# Patient Record
Sex: Female | Born: 2000 | Race: Black or African American | Hispanic: No | Marital: Single | Smoking: Never smoker
Health system: Southern US, Community
[De-identification: ages and names within clinical notes are randomized; demographics above are authoritative.]

## PROBLEM LIST (undated history)

## (undated) DIAGNOSIS — Z789 Other specified health status: Secondary | ICD-10-CM

## (undated) HISTORY — PX: NO PAST SURGERIES: SHX2092

---
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
## 2019-12-07 ENCOUNTER — Ambulatory Visit: Payer: Federal, State, Local not specified - PPO | Attending: Internal Medicine

## 2019-12-07 DIAGNOSIS — Z23 Encounter for immunization: Secondary | ICD-10-CM

## 2019-12-07 NOTE — Progress Notes (Signed)
   Covid-19 Vaccination Clinic  Name:  RAYANNA MATUSIK    MRN: [id number] DOB: 2001/08/07  12/07/2019  Ms. Sabatino was observed post Covid-19 immunization for 15 minutes without incident. She was provided with Vaccine Information Sheet and instruction to access the V-Safe system.   Ms. Sampedro was instructed to call 911 with any severe reactions post vaccine: Marland Kitchen Difficulty breathing  . Swelling of face and throat  . A fast heartbeat  . A bad rash all over body  . Dizziness and weakness   Immunizations Administered    Name Date Dose VIS Date Route   Pfizer COVID-19 Vaccine 12/07/2019 12:31 PM 0.3 mL 08/02/2019 Intramuscular   Manufacturer: [employer], [employer]   Lot: [id number]   NDC: [address]-4

## 2020-01-01 ENCOUNTER — Ambulatory Visit: Payer: Federal, State, Local not specified - PPO

## 2020-01-30 ENCOUNTER — Ambulatory Visit: Payer: Federal, State, Local not specified - PPO | Attending: Internal Medicine

## 2020-01-30 DIAGNOSIS — Z23 Encounter for immunization: Secondary | ICD-10-CM

## 2020-01-30 NOTE — Progress Notes (Signed)
   Covid-19 Vaccination Clinic  Name:  LISA MILIAN    MRN: [id number] DOB: 2001-08-20  01/30/2020  Ms. Roszak was observed post Covid-19 immunization for 15 minutes without incident. She was provided with Vaccine Information Sheet and instruction to access the V-Safe system.   Ms. Riga was instructed to call 911 with any severe reactions post vaccine: Marland Kitchen Difficulty breathing  . Swelling of face and throat  . A fast heartbeat  . A bad rash all over body  . Dizziness and weakness   Immunizations Administered    Name Date Dose VIS Date Route   Pfizer COVID-19 Vaccine 01/30/2020  1:32 PM 0.3 mL 10/16/2018 Intramuscular   Manufacturer: [employer], [employer]   Lot: [id number]   NDC: [address]-0

## 2023-08-23 NOTE — L&D Delivery Note (Signed)
 Operative Delivery Note At 2:12 PM a viable and healthy female was delivered via Vaginal, Vacuum Investment banker, operational).  Presentation: vertex; Position: Left,, Occiput,, Anterior; Station: +2.  Verbal consent: obtained from patient.  Risks and benefits discussed in detail.  Risks include, but are not limited to the risks of anesthesia, bleeding, infection, damage to maternal tissues, fetal cephalhematoma.  There is also the risk of inability to effect vaginal delivery of the head, or shoulder dystocia that cannot be resolved by established maneuvers, leading to the need for emergency cesarean section. Indication: terminal bradycardia APGAR: 8, 9; weight 7 lb 7.2 oz (3380 g).   Placenta status:  spontaneous intact not sent, .   Cord:  with the following complications:  tight nuchal cord x 1 clamped and cut.  Cord pH: 7.16 Indication: terminal bradycardia Anesthesia:  eipidural Instruments: mushroom vacuum Episiotomy: Median Lacerations: 4th degree Suture Repair:  the upper limit of the rectal mucosa was identified. Length of the rectal laceration  3.5 cm. The rectal mucosa was approximated with 4-0vicryl  suture, the endovaginal fascia was approximated with 2-0 Vicryl   suture, the sphincter were isolated and O vicryl figure of eight sutures x 4 was placed. The remaining repair was then closed with 3-0 chromic suture in layers and the skin approximated with 3-0 chromic sutures. Rectal exam, revealed good approximation Est. Blood Loss (mL): 570  Mom to postpartum.  Baby to Couplet care / Skin to Skin.  Anna Bentley A Anna Bentley 04/25/2024, 4:40 PM

## 2023-10-11 LAB — OB RESULTS CONSOLE VARICELLA ZOSTER ANTIBODY, IGG: Varicella: IMMUNE

## 2023-10-11 LAB — OB RESULTS CONSOLE HEPATITIS B SURFACE ANTIGEN: Hepatitis B Surface Ag: NEGATIVE

## 2023-10-11 LAB — OB RESULTS CONSOLE HIV ANTIBODY (ROUTINE TESTING): HIV: NONREACTIVE

## 2023-10-11 LAB — HEPATITIS C ANTIBODY: HCV Ab: NEGATIVE

## 2023-10-11 LAB — OB RESULTS CONSOLE RUBELLA ANTIBODY, IGM: Rubella: IMMUNE

## 2023-10-11 LAB — OB RESULTS CONSOLE RPR: RPR: NONREACTIVE

## 2024-01-31 LAB — OB RESULTS CONSOLE RPR: RPR: NONREACTIVE

## 2024-03-27 LAB — OB RESULTS CONSOLE GBS: GBS: POSITIVE

## 2024-04-21 ENCOUNTER — Inpatient Hospital Stay (HOSPITAL_COMMUNITY): Admission: RE | Admit: 2024-04-21 | Source: Home / Self Care | Admitting: Obstetrics and Gynecology

## 2024-04-24 ENCOUNTER — Encounter (HOSPITAL_COMMUNITY): Payer: Self-pay | Admitting: *Deleted

## 2024-04-24 ENCOUNTER — Other Ambulatory Visit: Payer: Self-pay

## 2024-04-24 ENCOUNTER — Inpatient Hospital Stay (HOSPITAL_COMMUNITY)
Admission: AD | Admit: 2024-04-24 | Discharge: 2024-04-27 | DRG: 768 | Disposition: A | Discharge status: Home / Self Care | Attending: Obstetrics and Gynecology | Admitting: Obstetrics and Gynecology

## 2024-04-24 DIAGNOSIS — Z833 Family history of diabetes mellitus: Secondary | ICD-10-CM

## 2024-04-24 DIAGNOSIS — O9902 Anemia complicating childbirth: Secondary | ICD-10-CM | POA: Diagnosis present

## 2024-04-24 DIAGNOSIS — O99824 Streptococcus B carrier state complicating childbirth: Secondary | ICD-10-CM | POA: Diagnosis present

## 2024-04-24 DIAGNOSIS — Z3A40 40 weeks gestation of pregnancy: Secondary | ICD-10-CM

## 2024-04-24 HISTORY — DX: Other specified health status: Z78.9

## 2024-04-24 LAB — POCT FERN TEST: POCT Fern Test: NEGATIVE

## 2024-04-24 MED ORDER — LACTATED RINGERS IV SOLN: INTRAVENOUS | Status: DC

## 2024-04-24 NOTE — MAU Note (Incomplete)
 Anna Bentley is a 23 y.o. at [redacted]w[redacted]d here in MAU reporting: Ctx since 6am. Now apart Reports some wetness but no big gushes of fluid and denise bleeding. Good fetal movement reported.   LMP:  Onset of complaint:6am Pain score: 6 Vitals:   04/24/24 2255  BP: (!) 133/90  Pulse: (!) 107  Resp: 18  Temp: 98 F (36.7 C)     FHT: 145  Lab orders placed from triage: labor  Ctx since

## 2024-04-25 ENCOUNTER — Inpatient Hospital Stay (HOSPITAL_COMMUNITY): Admitting: Anesthesiology

## 2024-04-25 ENCOUNTER — Encounter (HOSPITAL_COMMUNITY): Payer: Self-pay | Admitting: Obstetrics and Gynecology

## 2024-04-25 DIAGNOSIS — O99824 Streptococcus B carrier state complicating childbirth: Secondary | ICD-10-CM | POA: Diagnosis present

## 2024-04-25 DIAGNOSIS — O26893 Other specified pregnancy related conditions, third trimester: Secondary | ICD-10-CM | POA: Diagnosis present

## 2024-04-25 DIAGNOSIS — O9902 Anemia complicating childbirth: Secondary | ICD-10-CM | POA: Diagnosis present

## 2024-04-25 DIAGNOSIS — Z3A40 40 weeks gestation of pregnancy: Secondary | ICD-10-CM | POA: Diagnosis not present

## 2024-04-25 DIAGNOSIS — Z833 Family history of diabetes mellitus: Secondary | ICD-10-CM | POA: Diagnosis not present

## 2024-04-25 LAB — CBC
HCT: 36.8 % (ref 36.0–46.0)
HCT: 36.9 % (ref 36.0–46.0)
Hemoglobin: 11.7 g/dL — ABNORMAL LOW (ref 12.0–15.0)
Hemoglobin: 11.7 g/dL — ABNORMAL LOW (ref 12.0–15.0)
MCH: 28.1 pg (ref 26.0–34.0)
MCH: 28.5 pg (ref 26.0–34.0)
MCHC: 31.7 g/dL (ref 30.0–36.0)
MCHC: 31.8 g/dL (ref 30.0–36.0)
MCV: 88.5 fL (ref 80.0–100.0)
MCV: 89.5 fL (ref 80.0–100.0)
Platelets: 265 K/uL (ref 150–400)
Platelets: 275 K/uL (ref 150–400)
RBC: 4.11 MIL/uL (ref 3.87–5.11)
RBC: 4.17 MIL/uL (ref 3.87–5.11)
RDW: 15.2 % (ref 11.5–15.5)
RDW: 15.2 % (ref 11.5–15.5)
WBC: 10.2 K/uL (ref 4.0–10.5)
WBC: 8.8 K/uL (ref 4.0–10.5)
nRBC: 0 % (ref 0.0–0.2)
nRBC: 0 % (ref 0.0–0.2)

## 2024-04-25 LAB — TYPE AND SCREEN
ABO/RH(D): A POS
Antibody Screen: NEGATIVE

## 2024-04-25 LAB — RPR: RPR Ser Ql: NONREACTIVE

## 2024-04-25 MED ORDER — OXYTOCIN BOLUS FROM INFUSION
333.0000 mL | Freq: Once | INTRAVENOUS | Status: AC
  Administered 2024-04-25: 333 mL via INTRAVENOUS

## 2024-04-25 MED ORDER — ONDANSETRON HCL 4 MG PO TABS
4.0000 mg | ORAL_TABLET | ORAL | Status: DC | PRN
Start: 2024-04-25 — End: 2024-04-27

## 2024-04-25 MED ORDER — PRENATAL MULTIVITAMIN CH
1.0000 | ORAL_TABLET | Freq: Every day | ORAL | Status: DC
  Administered 2024-04-26 – 2024-04-27 (×2): 1 via ORAL
  Filled 2024-04-25 (×2): qty 1

## 2024-04-25 MED ORDER — SENNOSIDES-DOCUSATE SODIUM 8.6-50 MG PO TABS
2.0000 | ORAL_TABLET | Freq: Every day | ORAL | Status: DC
Start: 2024-04-26 — End: 2024-04-27
  Administered 2024-04-26 – 2024-04-27 (×2): 2 via ORAL
  Filled 2024-04-25 (×2): qty 2

## 2024-04-25 MED ORDER — LACTATED RINGERS IV SOLN
500.0000 mL | Freq: Once | INTRAVENOUS | Status: AC
  Administered 2024-04-25: 500 mL via INTRAVENOUS

## 2024-04-25 MED ORDER — PHENYLEPHRINE 80 MCG/ML (10ML) SYRINGE FOR IV PUSH (FOR BLOOD PRESSURE SUPPORT)
80.0000 ug | PREFILLED_SYRINGE | INTRAVENOUS | Status: DC | PRN
  Filled 2024-04-25: qty 10

## 2024-04-25 MED ORDER — EPHEDRINE 5 MG/ML INJ: 10.0000 mg | INTRAVENOUS | Status: DC | PRN

## 2024-04-25 MED ORDER — DIPHENHYDRAMINE HCL 25 MG PO CAPS: 25.0000 mg | ORAL_CAPSULE | Freq: Four times a day (QID) | ORAL | Status: DC | PRN

## 2024-04-25 MED ORDER — SOD CITRATE-CITRIC ACID 500-334 MG/5ML PO SOLN: 30.0000 mL | ORAL | Status: DC | PRN

## 2024-04-25 MED ORDER — ONDANSETRON HCL 4 MG/2ML IJ SOLN: 4.0000 mg | INTRAMUSCULAR | Status: DC | PRN

## 2024-04-25 MED ORDER — TERBUTALINE SULFATE 1 MG/ML IJ SOLN: 0.2500 mg | Freq: Once | INTRAMUSCULAR | Status: DC | PRN

## 2024-04-25 MED ORDER — BENZOCAINE-MENTHOL 20-0.5 % EX AERO
1.0000 | INHALATION_SPRAY | CUTANEOUS | Status: DC | PRN
  Administered 2024-04-26: 1 via TOPICAL
  Filled 2024-04-25 (×2): qty 56

## 2024-04-25 MED ORDER — OXYTOCIN 10 UNIT/ML IJ SOLN: 10.0000 [IU] | Freq: Once | INTRAMUSCULAR | Status: DC

## 2024-04-25 MED ORDER — DIPHENHYDRAMINE HCL 50 MG/ML IJ SOLN
25.0000 mg | Freq: Once | INTRAMUSCULAR | Status: AC
  Administered 2024-04-25: 25 mg via INTRAVENOUS
  Filled 2024-04-25: qty 1

## 2024-04-25 MED ORDER — FENTANYL CITRATE (PF) 100 MCG/2ML IJ SOLN: 100.0000 ug | INTRAMUSCULAR | Status: DC | PRN

## 2024-04-25 MED ORDER — ONDANSETRON HCL 4 MG/2ML IJ SOLN: 4.0000 mg | Freq: Four times a day (QID) | INTRAMUSCULAR | Status: DC | PRN

## 2024-04-25 MED ORDER — OXYCODONE HCL 5 MG PO TABS: 5.0000 mg | ORAL_TABLET | ORAL | Status: DC | PRN

## 2024-04-25 MED ORDER — OXYCODONE-ACETAMINOPHEN 5-325 MG PO TABS: 2.0000 | ORAL_TABLET | ORAL | Status: DC | PRN

## 2024-04-25 MED ORDER — LIDOCAINE HCL (PF) 1 % IJ SOLN
30.0000 mL | INTRAMUSCULAR | Status: AC | PRN
  Administered 2024-04-25: 30 mL via SUBCUTANEOUS
  Filled 2024-04-25: qty 30

## 2024-04-25 MED ORDER — ACETAMINOPHEN 325 MG PO TABS: 650.0000 mg | ORAL_TABLET | ORAL | Status: DC | PRN

## 2024-04-25 MED ORDER — DIPHENHYDRAMINE HCL 50 MG/ML IJ SOLN: 12.5000 mg | INTRAMUSCULAR | Status: DC | PRN

## 2024-04-25 MED ORDER — ZOLPIDEM TARTRATE 5 MG PO TABS: 5.0000 mg | ORAL_TABLET | Freq: Every evening | ORAL | Status: DC | PRN

## 2024-04-25 MED ORDER — OXYCODONE-ACETAMINOPHEN 5-325 MG PO TABS: 1.0000 | ORAL_TABLET | ORAL | Status: DC | PRN

## 2024-04-25 MED ORDER — DIBUCAINE (PERIANAL) 1 % EX OINT
1.0000 | TOPICAL_OINTMENT | CUTANEOUS | Status: DC | PRN
Start: 2024-04-25 — End: 2024-04-27

## 2024-04-25 MED ORDER — OXYCODONE HCL 5 MG PO TABS: 10.0000 mg | ORAL_TABLET | ORAL | Status: DC | PRN

## 2024-04-25 MED ORDER — OXYTOCIN-SODIUM CHLORIDE 30-0.9 UT/500ML-% IV SOLN
2.5000 [IU]/h | INTRAVENOUS | Status: DC
  Administered 2024-04-25: 2.5 [IU]/h via INTRAVENOUS

## 2024-04-25 MED ORDER — OXYTOCIN-SODIUM CHLORIDE 30-0.9 UT/500ML-% IV SOLN
1.0000 m[IU]/min | INTRAVENOUS | Status: DC
  Administered 2024-04-25: 2 m[IU]/min via INTRAVENOUS
  Filled 2024-04-25: qty 500

## 2024-04-25 MED ORDER — SIMETHICONE 80 MG PO CHEW: 80.0000 mg | CHEWABLE_TABLET | ORAL | Status: DC | PRN

## 2024-04-25 MED ORDER — FERROUS SULFATE 325 (65 FE) MG PO TABS
325.0000 mg | ORAL_TABLET | Freq: Two times a day (BID) | ORAL | Status: DC
  Administered 2024-04-26 – 2024-04-27 (×3): 325 mg via ORAL
  Filled 2024-04-25 (×3): qty 1

## 2024-04-25 MED ORDER — IBUPROFEN 600 MG PO TABS
600.0000 mg | ORAL_TABLET | Freq: Four times a day (QID) | ORAL | Status: DC
  Administered 2024-04-25 – 2024-04-27 (×7): 600 mg via ORAL
  Filled 2024-04-25 (×8): qty 1

## 2024-04-25 MED ORDER — LIDOCAINE HCL (PF) 1 % IJ SOLN
INTRAMUSCULAR | Status: DC | PRN
  Administered 2024-04-25: 8 mL via EPIDURAL

## 2024-04-25 MED ORDER — MINERAL OIL PO OIL
TOPICAL_OIL | Freq: Every day | ORAL | Status: DC
  Administered 2024-04-26 – 2024-04-27 (×2): 30 mL via ORAL
  Filled 2024-04-25 (×3): qty 30

## 2024-04-25 MED ORDER — PHENYLEPHRINE 80 MCG/ML (10ML) SYRINGE FOR IV PUSH (FOR BLOOD PRESSURE SUPPORT): 80.0000 ug | PREFILLED_SYRINGE | INTRAVENOUS | Status: DC | PRN

## 2024-04-25 MED ORDER — PENICILLIN G POT IN DEXTROSE 60000 UNIT/ML IV SOLN
3.0000 10*6.[IU] | INTRAVENOUS | Status: DC
  Administered 2024-04-25 (×2): 3 10*6.[IU] via INTRAVENOUS
  Filled 2024-04-25 (×2): qty 50

## 2024-04-25 MED ORDER — FENTANYL-BUPIVACAINE-NACL 0.5-0.125-0.9 MG/250ML-% EP SOLN
12.0000 mL/h | EPIDURAL | Status: DC | PRN
  Administered 2024-04-25: 12 mL/h via EPIDURAL
  Filled 2024-04-25: qty 250

## 2024-04-25 MED ORDER — WITCH HAZEL-GLYCERIN EX PADS: 1.0000 | MEDICATED_PAD | CUTANEOUS | Status: DC | PRN

## 2024-04-25 MED ORDER — COCONUT OIL OIL: 1.0000 | TOPICAL_OIL | Status: DC | PRN

## 2024-04-25 MED ORDER — LACTATED RINGERS IV SOLN: 500.0000 mL | INTRAVENOUS | Status: DC | PRN

## 2024-04-25 MED ORDER — PENICILLIN G POTASSIUM 5000000 UNITS IJ SOLR
5.0000 10*6.[IU] | Freq: Once | INTRAMUSCULAR | Status: AC
  Administered 2024-04-25: 5 10*6.[IU] via INTRAVENOUS
  Filled 2024-04-25: qty 5

## 2024-04-25 MED ORDER — CALCIUM CARBONATE ANTACID 500 MG PO CHEW
400.0000 mg | CHEWABLE_TABLET | Freq: Once | ORAL | Status: AC
  Administered 2024-04-25: 400 mg via ORAL
  Filled 2024-04-25: qty 2

## 2024-04-25 NOTE — H&P (Signed)
 Anna Bentley is a 23 y.o. female presenting @ 40 3/[redacted] wk gestation in early labor. GBS cx positive. OB History     Gravida  1   Para      Term      Preterm      AB      Living         SAB      IAB      Ectopic      Multiple      Live Births             Past Medical History:  Diagnosis Date   Medical history non-contributory    Past Surgical History:  Procedure Laterality Date   NO PAST SURGERIES     Family History: family history includes Cancer in her maternal grandmother; Diabetes in her mother. Social History:  reports that she has never smoked. She has never used smokeless tobacco. She reports that she does not drink alcohol and does not use drugs.     Maternal Diabetes: No Genetic Screening: Normal Maternal Ultrasounds/Referrals: Normal Fetal Ultrasounds or other Referrals:  None Maternal Substance Abuse:  No Significant Maternal Medications:  None Significant Maternal Lab Results:  Group B Strep positive Number of Prenatal Visits:greater than 3 verified prenatal visits Maternal Vaccinations:TDap Other Comments:  None  Review of Systems  All other systems reviewed and are negative.  History Dilation: 4 Effacement (%): 80 Station: -2 Exam by:: Anna Foods, RN Blood pressure (!) 138/90, pulse 91, temperature 98 F (36.7 C), resp. rate 18, height 5' 4 (1.626 m), weight 84.4 kg, SpO2 99%. Exam Physical Exam Constitutional:      Appearance: Normal appearance.  HENT:     Head: Atraumatic.  Eyes:     Extraocular Movements: Extraocular movements intact.  Cardiovascular:     Rate and Rhythm: Regular rhythm.     Heart sounds: Normal heart sounds.  Pulmonary:     Breath sounds: Normal breath sounds.  Abdominal:     Comments: gravid  Musculoskeletal:     Cervical back: Neck supple.  Skin:    General: Skin is warm and dry.  Neurological:     General: No focal deficit present.     Mental Status: She is alert and oriented to person,  place, and time.  Psychiatric:        Mood and Affect: Mood normal.        Behavior: Behavior normal.     Prenatal labs: ABO, Rh: --/--/PENDING (09/04 0017) Antibody: PENDING (09/04 0017) Rubella:  Immune RPR:   NR HBsAg:   neg HIV:   neg GBS:   positive  Assessment/Plan: Early labor GBS cx positive Postterm P) admit routine labs. IV PCN. Analgesic prn. Pitocin  prn   Anna Bentley 04/25/2024, 1:14 AM

## 2024-04-25 NOTE — Anesthesia Procedure Notes (Addendum)
 Epidural Patient location during procedure: OB Start time: 04/25/2024 5:48 AM End time: 04/25/2024 6:04 AM  Staffing Anesthesiologist: Merla Almarie HERO, DO Performed: anesthesiologist   Preanesthetic Checklist Completed: patient identified, IV checked, risks and benefits discussed, monitors and equipment checked, pre-op evaluation and timeout performed  Epidural Patient position: sitting Prep: DuraPrep and site prepped and draped Patient monitoring: continuous pulse ox, blood pressure, heart rate and cardiac monitor Approach: midline Location: L3-L4 Injection technique: LOR air  Needle:  Needle type: Tuohy  Needle gauge: 17 G Needle length: 9 cm Needle insertion depth: 6 cm Catheter type: closed end flexible Catheter size: 19 Gauge Catheter at skin depth: 11 cm Test dose: negative  Assessment Sensory level: T8 Events: blood not aspirated, cerebrospinal fluid, injection not painful, no injection resistance, no paresthesia and negative IV test  Additional Notes Patient identified. Risks/Benefits/Options discussed with patient including but not limited to bleeding, infection, nerve damage, paralysis, failed block, incomplete pain control, headache, blood pressure changes, nausea, vomiting, reactions to medication both or allergic, itching and postpartum back pain. Confirmed with bedside nurse the patient's most recent platelet count. Confirmed with patient that they are not currently taking any anticoagulation, have any bleeding history or any family history of bleeding disorders. Patient expressed understanding and wished to proceed. All questions were answered. Sterile technique was used throughout the entire procedure. Please see nursing notes for vital signs. Test dose was given through epidural catheter and negative prior to continuing to dose epidural or start infusion. Warning signs of high block given to the patient including shortness of breath, tingling/numbness in hands,  complete motor block, or any concerning symptoms with instructions to call for help. Patient was given instructions on fall risk and not to get out of bed. All questions and concerns addressed with instructions to call with any issues or inadequate analgesia.    Pt very jumpy, crying during procedure. + wet tap at L2-3, normal placement at L4-5.Reason for block:procedure for pain

## 2024-04-25 NOTE — Anesthesia Preprocedure Evaluation (Signed)
 Anesthesia Evaluation  Patient identified by MRN, date of birth, ID band Patient awake    Reviewed: Allergy & Precautions, Patient's Chart, lab work & pertinent test results  Airway Mallampati: II  TM Distance: >3 FB Neck ROM: Full    Dental no notable dental hx.    Pulmonary neg pulmonary ROS   Pulmonary exam normal breath sounds clear to auscultation       Cardiovascular negative cardio ROS Normal cardiovascular exam Rhythm:Regular Rate:Normal     Neuro/Psych negative neurological ROS  negative psych ROS   GI/Hepatic negative GI ROS, Neg liver ROS,,,  Endo/Other  negative endocrine ROS    Renal/GU negative Renal ROS  negative genitourinary   Musculoskeletal negative musculoskeletal ROS (+)    Abdominal   Peds negative pediatric ROS (+)  Hematology  (+) Blood dyscrasia, anemia Hb 11.7, plt 275   Anesthesia Other Findings   Reproductive/Obstetrics (+) Pregnancy                              Anesthesia Physical Anesthesia Plan  ASA: 2  Anesthesia Plan: Epidural   Post-op Pain Management:    Induction:   PONV Risk Score and Plan: 2  Airway Management Planned: Natural Airway  Additional Equipment: None  Intra-op Plan:   Post-operative Plan:   Informed Consent: I have reviewed the patients History and Physical, chart, labs and discussed the procedure including the risks, benefits and alternatives for the proposed anesthesia with the patient or authorized representative who has indicated his/her understanding and acceptance.       Plan Discussed with:   Anesthesia Plan Comments:         Anesthesia Quick Evaluation

## 2024-04-25 NOTE — Progress Notes (Signed)
 Anna Bentley is a 23 y.o. G1P0 at [redacted]w[redacted]d by LMP admitted for active labor  Subjective: Chief Complaint  Patient presents with   Labor Eval  Epidural  Objective: BP (!) 100/52   Pulse 91   Temp 97.9 F (36.6 C) (Oral)   Resp 16   Ht 5' 4 (1.626 m)   Wt 84.4 kg   SpO2 99%   BMI 31.93 kg/m  No intake/output data recorded. No intake/output data recorded.  FHT:  FHR: 120 bpm, variability: moderate,  accelerations:  Present,  decelerations:  Absent UC:   irregular, every 2-5 minutes SVE:   5 cm dilated,  sl edematous effaced, -2 station ROP Tracing: cat 1  Labs: Lab Results  Component Value Date   WBC 8.8 04/25/2024   HGB 11.7 (L) 04/25/2024   HCT 36.9 04/25/2024   MCV 88.5 04/25/2024   PLT 275 04/25/2024    Assessment / Plan: Arrest of dilation Postterm P) start pitocin .  Start circuit   Anticipated MOD:  NSVD  Keiyana Stehr A Varnell Orvis 04/25/2024, 8:14 AM

## 2024-04-26 LAB — CBC
HCT: 31.3 % — ABNORMAL LOW (ref 36.0–46.0)
Hemoglobin: 10.3 g/dL — ABNORMAL LOW (ref 12.0–15.0)
MCH: 28.5 pg (ref 26.0–34.0)
MCHC: 32.9 g/dL (ref 30.0–36.0)
MCV: 86.5 fL (ref 80.0–100.0)
Platelets: 242 K/uL (ref 150–400)
RBC: 3.62 MIL/uL — ABNORMAL LOW (ref 3.87–5.11)
RDW: 15.2 % (ref 11.5–15.5)
WBC: 15 K/uL — ABNORMAL HIGH (ref 4.0–10.5)
nRBC: 0 % (ref 0.0–0.2)

## 2024-04-26 NOTE — Lactation Note (Signed)
 This note was copied from a baby's chart. Lactation Consultation Note  Patient Name: Anna Bentley Date: 04/26/2024 Age:24 hours Reason for consult: Initial assessment;1st time breastfeeding;Term  P1, Mother states she would like to breastfeed and formula feed. Baby recently consumed 4 ml of formula. Reviewed hand expression bilaterally. Attempted latching in both cross cradle and football holds placing baby skin to skin on mother. Baby opened wide and latched but did not suck after a few attempts, feeding attempt stopped. Suggest calling for [facility] or RN to assist with next feeding.  Feed on demand with cues.  Goal 8-12+ times per day after first 24 hrs.  Place baby STS if not cueing.  Encouraged mother to offer the breast before formula to help establish her milk supply.   Maternal Data Has patient been taught Hand Expression?: Yes Does the patient have breastfeeding experience prior to this delivery?: No  Feeding Mother's Current Feeding Choice: Breast Milk and Formula  LATCH Score Latch: Repeated attempts needed to sustain latch, nipple held in mouth throughout feeding, stimulation needed to elicit sucking reflex.  Audible Swallowing: None  Type of Nipple: Everted at rest and after stimulation  Comfort (Breast/Nipple): Soft / non-tender  Hold (Positioning): Assistance needed to correctly position infant at breast and maintain latch.  LATCH Score: 6 Interventions Interventions: Breast feeding basics reviewed;Assisted with latch;Skin to skin;Hand express;Position options;Support pillows;Education;CDC milk storage guidelines;LC Services brochure  Discharge Pump: Personal;Hands Free  Consult Status Consult Status: Follow-up Date: 04/27/24 Follow-up type: In-patient   Shannon Dines Boschen  RN, IBCLC 04/26/2024, 8:12 AM

## 2024-04-26 NOTE — Anesthesia Postprocedure Evaluation (Signed)
 Anesthesia Post Note  Patient: RAYNELL UPTON  Procedure(s) Performed: AN AD HOC LABOR EPIDURAL     Patient location during evaluation: Mother Baby Anesthesia Type: Epidural Level of consciousness: awake Pain management: pain level controlled Vital Signs Assessment: post-procedure vital signs reviewed and stable Respiratory status: spontaneous breathing, nonlabored ventilation and respiratory function stable Cardiovascular status: stable Postop Assessment: no headache, no backache and epidural receding Comments: Patient had accidental dural puncture during epidural placement. She is now >30 hours out from placement. She denies headache. She was sitting up on the side of the bed holding her baby upon my entering the room. She has been up walking around and going to the bathroom without issue. She requested that her epidural be removed and she would like to shower. Discussed symptoms of PDPH with patient and instructed her to have the nurse call us  if she develops those symptoms. She expressed understanding.   No notable events documented.  Last Vitals:  Vitals:   04/26/24 0832 04/26/24 0848  BP: 118/85 120/79  Pulse: 89 76  Resp: 17   Temp: 36.6 C   SpO2: 99%     Last Pain:  Vitals:   04/26/24 1110  TempSrc:   PainSc: 4                  Delon Aisha Arch

## 2024-04-26 NOTE — Progress Notes (Signed)
 PPD1 SVD:   S:  Pt reports feeling well/ Tolerating po/ Voiding without problems/ No n/v/ Bleeding is moderate/ Pain controlled withprescription NSAID's including ibuprofen  (Motrin )  Newborn info live female BRF/BF   O:  A & O x 3 / VS: Blood pressure 117/75, pulse 95, temperature 98.2 F (36.8 C), resp. rate 16, height 5' 4 (1.626 m), weight 84.4 kg, SpO2 98%, unknown if currently breastfeeding.  LABS:  Results for orders placed or performed during the hospital encounter of 04/24/24 (from the past 24 hours)  CBC     Status: Abnormal   Collection Time: 04/25/24  4:02 PM  Result Value Ref Range   WBC 10.2 4.0 - 10.5 K/uL   RBC 4.11 3.87 - 5.11 MIL/uL   Hemoglobin 11.7 (L) 12.0 - 15.0 g/dL   HCT 63.1 63.9 - 53.9 %   MCV 89.5 80.0 - 100.0 fL   MCH 28.5 26.0 - 34.0 pg   MCHC 31.8 30.0 - 36.0 g/dL   RDW 84.7 88.4 - 84.4 %   Platelets 265 150 - 400 K/uL   nRBC 0.0 0.0 - 0.2 %    I&O: I/O last 3 completed shifts: In: -  Out: 2897 [Urine:2175; Blood:722]   No intake/output data recorded.  Lungs: chest clear, no wheezing, rales, normal symmetric air entry  Heart: regular rate and rhythm, S1, S2 normal, no murmur, click, rub or gallop  Abdomen: uterus firm at umb nontender  Perineum: healing with good reapproximation  Lochia: moderate  Extremities:no redness or tenderness in the calves or thighs, no edema    A/P: PPD # 1/ G1P1001 S/p VAVD with 4th degree extension  Doing well  Continue routine post partum orders  Anticipate d/c in am

## 2024-04-26 NOTE — Lactation Note (Signed)
 This note was copied from a baby's chart. Lactation Consultation Note  Patient Name: Anna Bentley Unijb'd Date: 04/26/2024 Age:24 hours  2nd time attempted to see mom. Dad was changing baby's diaper. Mom resting. Mom stated she hasn't attempted to BF yet she has felt overwhelmed. She will try today. Encouraged mom to call for Lactation today to see latch. Mom shook her head yes.   Maternal Data    Feeding    LATCH Score                    Lactation Tools Discussed/Used    Interventions    Discharge    Consult Status      Telesforo Brosnahan G 04/26/2024, 2:48 AM

## 2024-04-26 NOTE — Lactation Note (Signed)
 This note was copied from a baby's chart. Lactation Consultation Note  Patient Name: Anna Bentley Unijb'd Date: 04/26/2024 Age:24 hours Reason for consult: Follow-up assessment;1st time breastfeeding;Term  P1, Returned to room to assist with feeding. Baby demonstrating feeding cues. Reviewed basics with mother including suck training.  Assisted with latching and baby latched briefly, a few sucking bursts.   Baby still continues to have difficulty grabbing and pulling nipple at this time. Baby was supplement with slow flow nipple and formula. A few attempts were made also with the botte to elicit sucking. Call for help with latching as needed.    Maternal Data Has patient been taught Hand Expression?: Yes  Feeding Mother's Current Feeding Choice: Breast Milk and Formula  LATCH Score Latch: Repeated attempts needed to sustain latch, nipple held in mouth throughout feeding, stimulation needed to elicit sucking reflex.  Audible Swallowing: None  Type of Nipple: Everted at rest and after stimulation  Comfort (Breast/Nipple): Soft / non-tender  Hold (Positioning): Assistance needed to correctly position infant at breast and maintain latch.  LATCH Score: 6   Lactation Tools Discussed/Used  Manual pump  Interventions Interventions: Assisted with latch;Skin to skin;Hand express;Hand pump;Education  Discharge Pump: Personal;Hands Free;Manual  Consult Status Consult Status: Follow-up Date: 04/27/24 Follow-up type: In-patient   Shannon Levorn Lemme  RN, IBCLC 04/26/2024, 12:22 PM

## 2024-04-27 MED ORDER — IBUPROFEN 600 MG PO TABS: 600.0000 mg | ORAL_TABLET | Freq: Four times a day (QID) | ORAL | 11 refills | Status: AC | PRN

## 2024-04-27 MED ORDER — MINERAL OIL PO OIL: TOPICAL_OIL | ORAL | 2 refills | Status: AC

## 2024-04-27 MED ORDER — SENNOSIDES-DOCUSATE SODIUM 8.6-50 MG PO TABS: 2.0000 | ORAL_TABLET | Freq: Every day | ORAL | 11 refills | Status: AC

## 2024-04-27 NOTE — Progress Notes (Signed)
 CSW received and acknowledges consult for EDPS of 9. Consult screened out due to 9 on EDPS does not warrant a CSW consult. MOB whom scores are greater than 9/yes to question 10 on [place] Postpartum Depression Screen warrants a CSW consult.   Signed,  Sharyne LOIS Roulette, MSW, LCSWA, LCASA 04/27/2024 8:33 AM

## 2024-04-27 NOTE — Progress Notes (Signed)
 PPD{NUMBERS 1-5:20334} SVD:   S:  Pt reports feeling ***/ Tolerating po/ Voiding without problems/ No n/v/ Bleeding is {Description; bleeding vaginal:11356}/ Pain controlled with{treatments; pain control med:13496}  Newborn info ***   O:  A & O x 3 ***/ VS: Blood pressure 121/76, pulse 76, temperature 97.9 F (36.6 C), temperature source Oral, resp. rate 18, height 5' 4 (1.626 m), weight 84.4 kg, SpO2 99%, unknown if currently breastfeeding.  LABS: No results found for this or any previous visit (from the past 24 hours).  I&O: No intake/output data recorded.   No intake/output data recorded.  Lungs: {Exam; lungs:5033}  Heart: {Exam; heart:5510}  Abdomen: {PE ABDOMEN POSTPARTUM OBGYN:313106}  Perineum: {exam; perineum:10172}  Lochia: ***  Extremities:{pe extremities na:685541}    A/P: PPD # ***/ G1P1001  Doing well  Continue routine post partum orders  ***

## 2024-04-27 NOTE — Discharge Instructions (Signed)
 Call if temperature greater than equal to 100.4, nothing per vagina for 4-6 weeks or severe nausea vomiting, increased incisional pain , drainage or redness in the incision site, no straining with bowel movements, showers no bath  NO STRAINING with bowel movements. Continue daily use of mineral oil and stool softener. Use sitz bath. NO  rectal suppository

## 2024-04-27 NOTE — Discharge Summary (Signed)
 Postpartum Discharge Summary  Date of Service updated     Patient Name: Anna Bentley DOB: 13-Mar-2001 MRN: [id number]  Date of admission: 04/24/2024 Delivery date:04/25/2024 Delivering provider: Kyrin Bentley Date of discharge: 04/27/2024  Admitting diagnosis: Indication for care in labor and delivery, antepartum [O75.9] Vacuum-assisted vaginal delivery [Z37.9] Intrauterine pregnancy: [redacted]w[redacted]d     Secondary diagnosis:  Principal Problem:   Indication for care in labor and delivery, antepartum Active Problems:   Vacuum-assisted vaginal delivery  Additional problems: GBS cx  positive    Discharge diagnosis: Term Pregnancy Delivered and fourth degree laceration, GBS cx positive                                              Post partum procedures:n/a Augmentation: Pitocin  Complications: None  Hospital course: Onset of Labor With Vaginal Delivery      23 y.o. yo G1P1001 at [redacted]w[redacted]d was admitted in Latent Labor on 04/24/2024. Labor course was complicated by protracted active phase with advancement to complete with pitocin  augmentation. . Pitocin  augmentation  Membrane Rupture Time/Date: 12:09 PM,04/25/2024  Delivery Method:Vaginal, Vacuum (Extractor) Operative Delivery:Device used:mushroom vacuum  Indication: Fetal indications due to fetal bradycardia Episiotomy: Median Lacerations:  4th degree Patient had a postpartum course complicated by none.  She is ambulating, tolerating a regular diet, passing flatus, and urinating well. Patient is discharged home in stable condition on 04/27/2024  Newborn Data: Birth date:04/25/2024 Birth time:2:12 PM Gender:Female Living status:Living Apgars:8 ,9  Weight:3.38 kg  Magnesium Sulfate received: No BMZ received: No Rhophylac:N/A MMR:No T-DaP:Given prenatally Flu: No RSV Vaccine received: No Transfusion:No Immunizations administered: Immunization History  Administered Date(s) Administered   PFIZER(Purple Top)SARS-COV-2 Vaccination 12/07/2019,  01/30/2020    Physical exam  Vitals:   04/26/24 0848 04/26/24 1440 04/26/24 2016 04/27/24 0507  BP: 120/79 120/75 119/83 121/76  Pulse: 76 77 76   Resp:  17 17 18   Temp:  98 F (36.7 C) 97.8 F (36.6 C) 97.9 F (36.6 C)  TempSrc:  Oral Oral Oral  SpO2:  99%  99%  Weight:      Height:       General: alert, cooperative, and no distress Lochia: appropriate Uterine Fundus: firm Incision: N/A DVT Evaluation: No evidence of DVT seen on physical exam. No significant calf/ankle edema. Labs: Lab Results  Component Value Date   WBC 15.0 (H) 04/26/2024   HGB 10.3 (L) 04/26/2024   HCT 31.3 (L) 04/26/2024   MCV 86.5 04/26/2024   PLT 242 04/26/2024       No data to display         Edinburgh Score:    04/26/2024    5:00 PM  Edinburgh Postnatal Depression Scale Screening Tool  I have been able to laugh and see the funny side of things. 0  I have looked forward with enjoyment to things. 0  I have blamed myself unnecessarily when things went wrong. 2  I have been anxious or worried for no good reason. 2  I have felt scared or panicky for no good reason. 2  Things have been getting on top of me. 1  I have been so unhappy that I have had difficulty sleeping. 1  I have felt sad or miserable. 1  I have been so unhappy that I have been crying. 0  The thought of harming myself has occurred to me.  0  Edinburgh Postnatal Depression Scale Total 9      After visit meds:  Allergies as of 04/27/2024   No Known Allergies      Medication List     TAKE these medications    ibuprofen  600 MG tablet Commonly known as: ADVIL  Take 1 tablet (600 mg total) by mouth every 6 (six) hours as needed for mild pain (pain score 1-3) or moderate pain (pain score 4-6).   mineral oil liquid 30 ml daily   multivitamin-prenatal 27-0.8 MG Tabs tablet Take 1 tablet by mouth daily at 12 noon.   senna-docusate 8.6-50 MG tablet Commonly known as: Senokot-S Take 2 tablets by mouth at bedtime.          Discharge home in stable condition Infant Feeding: Bottle and Breast Infant Disposition:home with mother Discharge instruction: per After Visit Summary and Postpartum booklet. Activity: Advance as tolerated. Pelvic rest for 6 weeks.  Diet: routine diet Anticipated Birth Control: Unsure Postpartum Appointment:6 weeks Additional Postpartum F/U: 2 wk for rectal exam check Future Appointments:No future appointments. Follow up Visit:  Follow-up Information     Rutherford Gain, MD Follow up in 2 week(s).   Specialty: Obstetrics and Gynecology Contact information: [address] [place] [address] [phone]                     04/27/2024 [name], MD

## 2024-04-27 NOTE — Lactation Note (Signed)
 This note was copied from a baby's chart. Lactation Consultation Note  Patient Name: Boy Arlie Posch Unijb'd Date: 04/27/2024 Age:23, P1  Reason for consult: Follow-up assessment;Primapara;1st time breastfeeding;Term LC updated the doc flow sheets for last feeding.  LC reviewed breast feeding basics, supply and demand, engorgement prevention and tx, and Breast feeding D/C teaching.  LC recommended since moms milk isn't in yet,  Offer the breast 1st and then supplement , post pump, Next feeding switch to the other breast and do the same until she is able to see a LC O/P.  If the baby is to fussy to latch, feed and appetizer of EBM or formula ( 10 ml and then latch.    Maternal Data Has patient been taught Hand Expression?: Yes  Feeding Mother's Current Feeding Choice: Breast Milk and Formula Nipple Type: Slow - flow  LATCH Score - last score 8   Lactation Tools Discussed/Used  Hand pump   Interventions Interventions: Breast feeding basics reviewed;Hand pump;Education;CDC milk storage guidelines;CDC Guidelines for Breast Pump Cleaning;LC Services brochure  Discharge Discharge Education: Engorgement and breast care;Warning signs for feeding baby;Outpatient recommendation;Other (comment) (per mom plans to connect with the [facility] program for assistance) Pump: Personal;Manual;Hands Free  Consult Status Consult Status: Complete Date: 04/27/24    Rollene Caldron Haywood Meinders 04/27/2024, 1:23 PM

## 2024-05-06 ENCOUNTER — Telehealth (HOSPITAL_COMMUNITY): Payer: Self-pay | Admitting: *Deleted

## 2024-05-06 NOTE — Telephone Encounter (Signed)
 Attempted hospital discharge follow-up call. Left message for patient to return RN call with any questions or concerns. Allean IVAR Carton, RN, 05/06/24, [phone]
